# Patient Record
Sex: Female | Born: 2000 | Race: White | Hispanic: No | Marital: Single | Smoking: Never smoker
Health system: Southern US, Community
[De-identification: ages and names within clinical notes are randomized; demographics above are authoritative.]

---
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
## 2018-04-10 IMAGING — CR DG CHEST 2V
2 series · 2 of 2 positions shown · non-contrast
Comparison: None.

CLINICAL DATA: Shortness of breath for 1 week

EXAM:
CHEST  2 VIEW

[w chest pa]
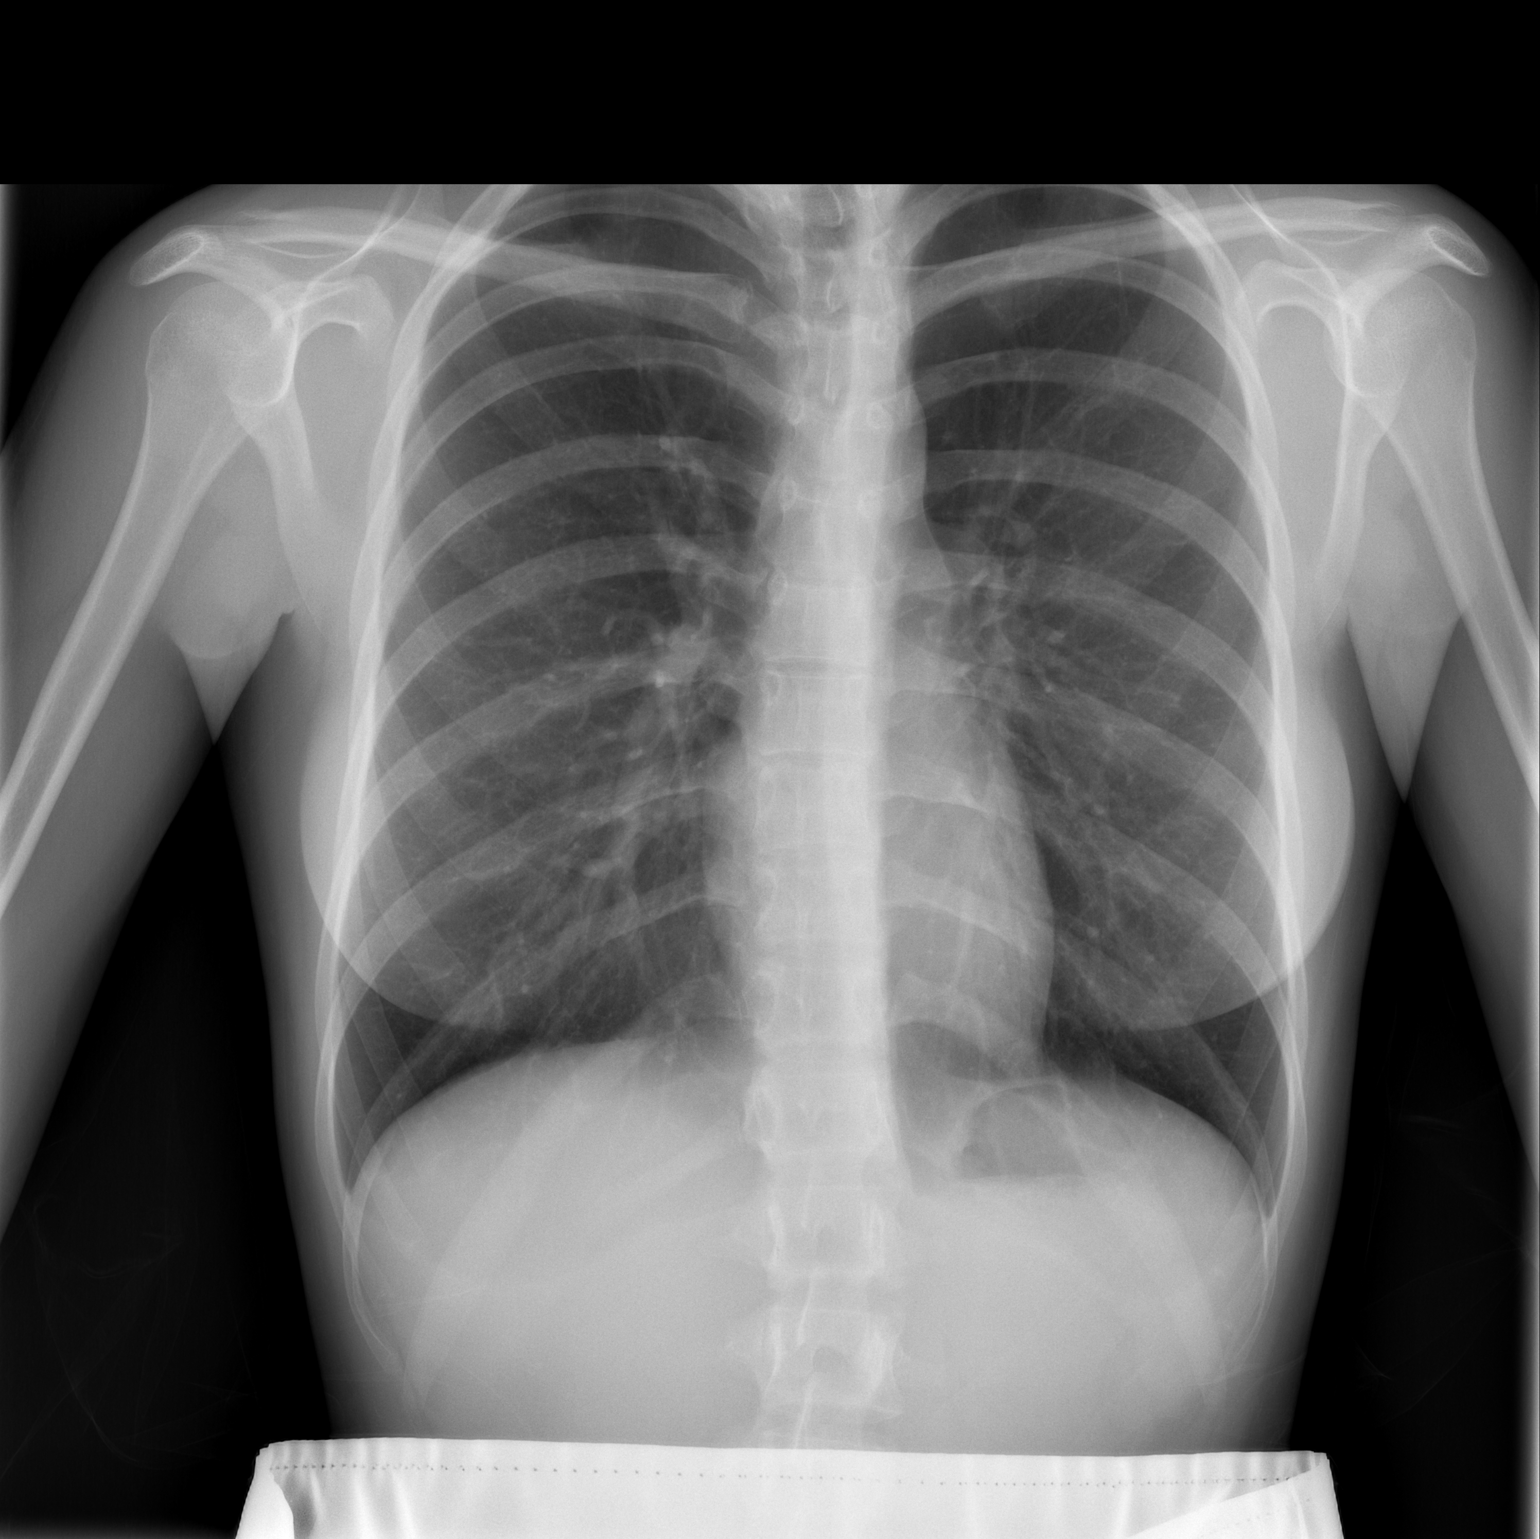

[w chest lat]
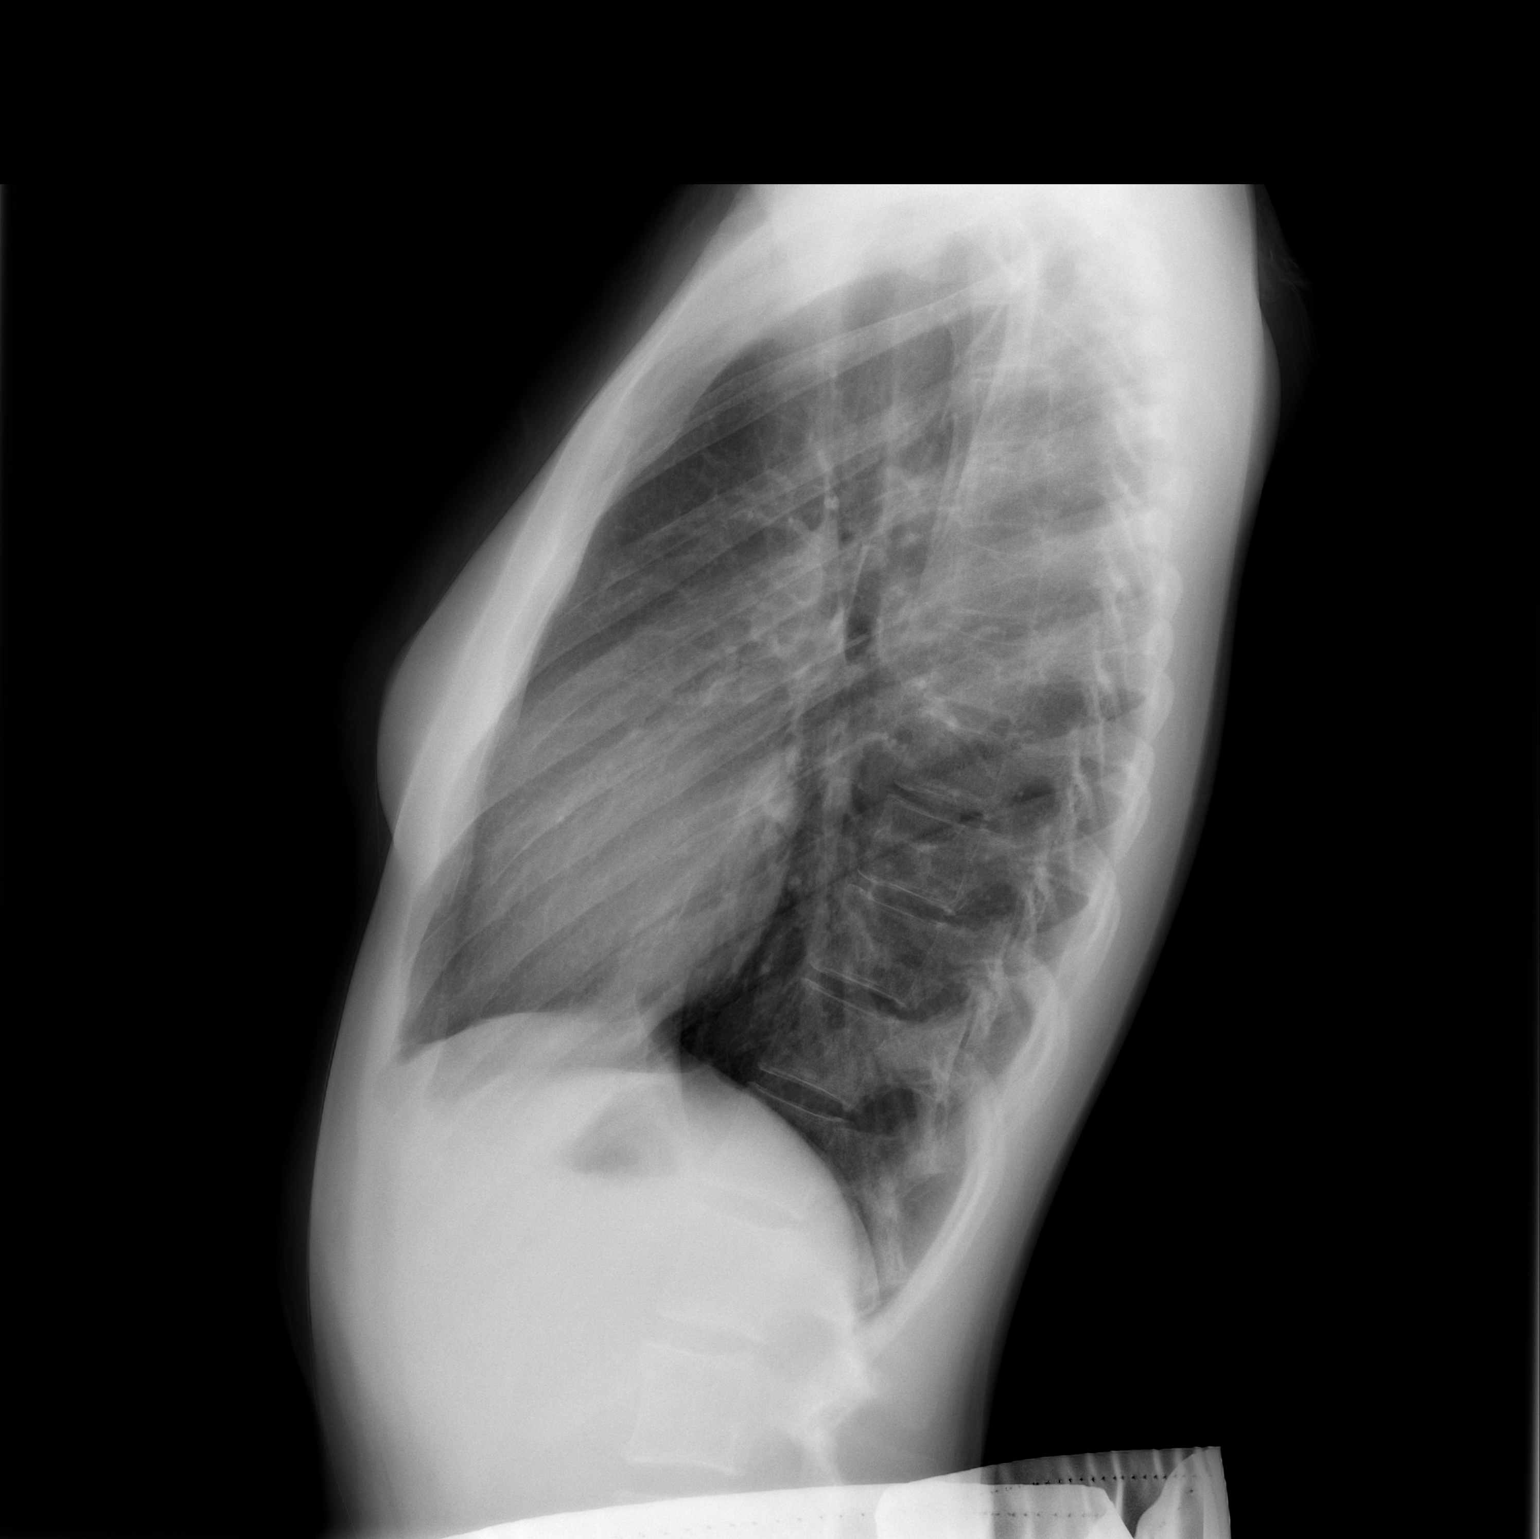

[2 of 2 positions shown; findings below may reference images not displayed]

FINDINGS: The heart size and mediastinal contours are within normal limits.
Both lungs are clear. The visualized skeletal structures are
unremarkable.
IMPRESSION: No active cardiopulmonary disease.

## 2023-08-29 ENCOUNTER — Ambulatory Visit
Admission: RE | Admit: 2023-08-29 | Discharge: 2023-08-29 | Disposition: A | Discharge status: Home / Self Care | Source: Ambulatory Visit | Attending: Family Medicine | Admitting: Family Medicine

## 2023-08-29 VITALS — BP 104/69 | HR 100 | Temp 98.5°F | Resp 17

## 2023-08-29 DIAGNOSIS — R21 Rash and other nonspecific skin eruption: Secondary | ICD-10-CM | POA: Insufficient documentation

## 2023-08-29 LAB — POCT URINE PREGNANCY: Preg Test, Ur: NEGATIVE

## 2023-08-29 MED ORDER — FLUCONAZOLE 150 MG PO TABS: 150.0000 mg | ORAL_TABLET | ORAL | 0 refills | Status: AC

## 2023-08-29 NOTE — Discharge Instructions (Signed)
 Please start fluconazole to address the rash for yeast vaginitis.  Pending your results, we will change the treatment plan for anything that comes back positive.

## 2023-08-29 NOTE — ED Triage Notes (Addendum)
 Patient presents to the office for vaginal itching x 1 week Self swab collected.

## 2023-08-29 NOTE — ED Provider Notes (Signed)
  [facility]  Note:  This document was prepared using Conservation officer, historic buildings and may include unintentional dictation errors.  MRN: [id number] DOB: Oct 26, 2000  Subjective:   Joan Weiss is a 23 y.o. female presenting for 1 week history of persistent vaginal itching and irritation with a few open painful sores in the perineum.  Denies fever, n/v, abdominal pain, pelvic pain, dysuria, urinary frequency, hematuria, vaginal discharge.  Patient is not opposed to STI testing.  No current facility-administered medications for this encounter. No current outpatient medications on file.   No Known Allergies  History reviewed. No pertinent past medical history.   History reviewed. No pertinent surgical history.  History reviewed. No pertinent family history.  Social History   Tobacco Use   Smoking status: Never   Smokeless tobacco: Never    ROS   Objective:   Vitals: BP 104/69 (BP Location: Left Arm)   Pulse 100   Temp 98.5 F (36.9 C) (Oral)   Resp 17   LMP 08/15/2023   SpO2 97%   Physical Exam Exam conducted with a chaperone present [employer] surgeon).  Constitutional:      General: She is not in acute distress.    Appearance: Normal appearance. She is well-developed. She is not ill-appearing, toxic-appearing or diaphoretic.  HENT:     Head: Normocephalic and atraumatic.     Nose: Nose normal.     Mouth/Throat:     Mouth: Mucous membranes are moist.  Eyes:     General: No scleral icterus.       Right eye: No discharge.        Left eye: No discharge.     Extraocular Movements: Extraocular movements intact.  Cardiovascular:     Rate and Rhythm: Normal rate.  Pulmonary:     Effort: Pulmonary effort is normal.  Genitourinary:   Skin:    General: Skin is warm and dry.  Neurological:     General: No focal deficit present.     Mental Status: She is alert and oriented to person, place, and time.  Psychiatric:        Mood and Affect:  Mood normal.        Behavior: Behavior normal.     Results for orders placed or performed during the hospital encounter of 08/29/23 (from the past 24 hours)  POCT urine pregnancy     Status: None   Collection Time: 08/29/23 11:35 AM  Result Value Ref Range   Preg Test, Ur Negative Negative    Assessment and Plan :   PDMP not reviewed this encounter.  1. Rash of genital area    HSV culture pending, vaginal cytology pending.  Recommended managing for acute vaginitis with oral fluconazole.  Will treat as appropriate pending positive test results.  Counseled patient on potential for adverse effects with medications prescribed/recommended today, ER and return-to-clinic precautions discussed, patient verbalized understanding.    Adolph Hoop, PA-C 08/29/23 1601

## 2023-08-30 ENCOUNTER — Ambulatory Visit (HOSPITAL_COMMUNITY): Payer: Self-pay

## 2023-08-30 LAB — CERVICOVAGINAL ANCILLARY ONLY
Bacterial Vaginitis (gardnerella): NEGATIVE
Candida Glabrata: NEGATIVE
Candida Vaginitis: NEGATIVE
Chlamydia: NEGATIVE
Comment: NEGATIVE
Comment: NEGATIVE
Comment: NEGATIVE
Comment: NEGATIVE
Comment: NEGATIVE
Comment: NORMAL
Neisseria Gonorrhea: NEGATIVE
Trichomonas: NEGATIVE

## 2023-08-30 LAB — HSV 1/2 PCR (SURFACE)
HSV-1 DNA: DETECTED — AB
HSV-2 DNA: NOT DETECTED

## 2023-08-30 MED ORDER — ACYCLOVIR 400 MG PO TABS: 400.0000 mg | ORAL_TABLET | Freq: Three times a day (TID) | ORAL | 0 refills | Status: AC

## 2024-01-30 ENCOUNTER — Ambulatory Visit

## 2024-01-30 DIAGNOSIS — Z23 Encounter for immunization: Secondary | ICD-10-CM

## 2024-05-08 ENCOUNTER — Ambulatory Visit: Payer: Self-pay
